# Patient Record
Sex: Male | Born: 1945 | Race: Black or African American | Hispanic: No | Marital: Single
Health system: Southern US, Community
[De-identification: ages and names within clinical notes are randomized; demographics above are authoritative.]

---

## 2006-10-06 ENCOUNTER — Emergency Department: Payer: Self-pay | Admitting: Emergency Medicine

## 2008-05-03 ENCOUNTER — Inpatient Hospital Stay: Payer: Self-pay | Admitting: Internal Medicine

## 2008-05-26 ENCOUNTER — Emergency Department: Payer: Self-pay | Admitting: Emergency Medicine

## 2008-09-03 ENCOUNTER — Ambulatory Visit: Payer: Self-pay | Admitting: Specialist

## 2010-05-12 ENCOUNTER — Ambulatory Visit: Payer: Self-pay | Admitting: Nephrology

## 2010-11-16 IMAGING — CT CT ABDOMEN AND PELVIS WITHOUT AND WITH CONTRAST
2 of 5 series · 11 of 42 positions shown, 17 images · non-contrast
Comparison: No comparison

REASON FOR EXAM: (1) gross hematuria, hypotension , to evaluate upper and
lower urinary tract; (2
COMMENTS:

PROCEDURE:     CT  - CT ABDOMEN / PELVIS  W/WO  - May 04, 2008  [DATE]
RESULT:     History: Hematuria
TECHNIQUE: Multiple axial images of the abdomen and pelvis were performed
from the lung bases to the pubic symphysis, without p.o. contrast and prior
to and following 100 ml of 5sovue-ISX intravenous contrast in the
nephrographic and excretory renal phases.

[Series 2: soft tissue w/o · axial · non-contrast · 0.70mm/px · z∈[-1508,-1198]mm · 8 of 80 slices shown, 13 images]
[im 9/80  soft-tissue]
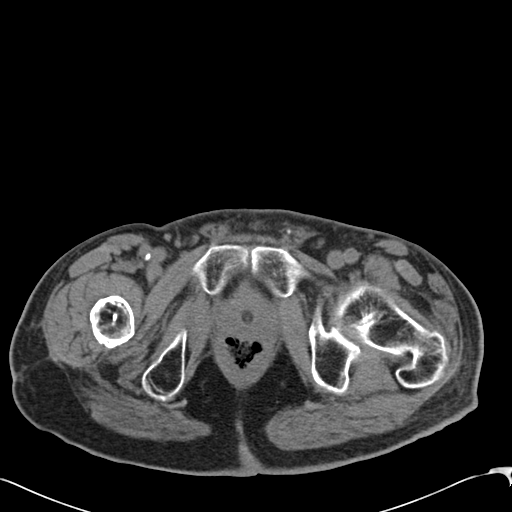
[im 9/80  bone]
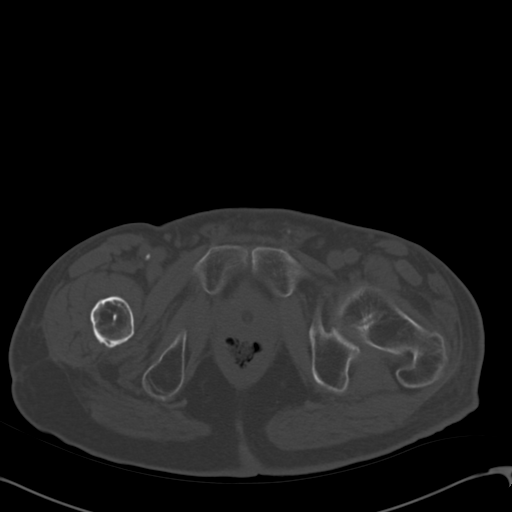
[im 18/80  soft-tissue]
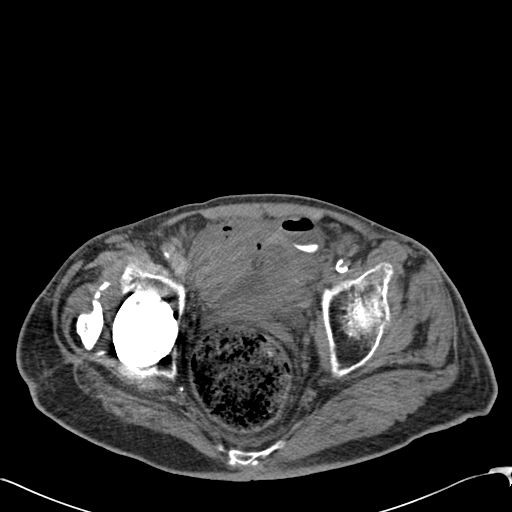
[im 27/80  soft-tissue]
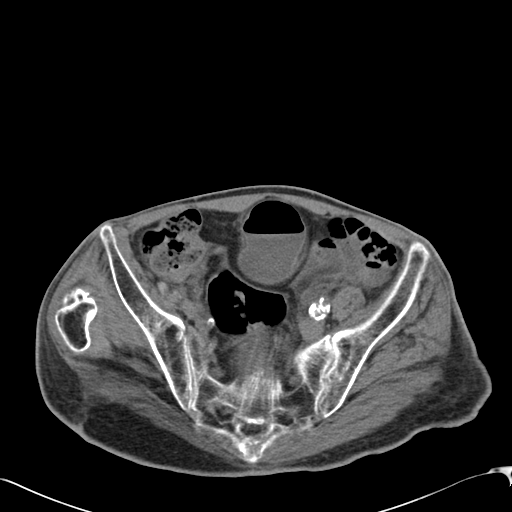
[im 36/80  soft-tissue]
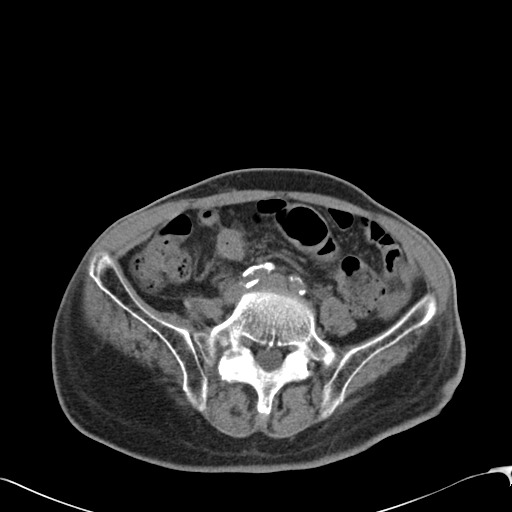
[im 44/80  soft-tissue]
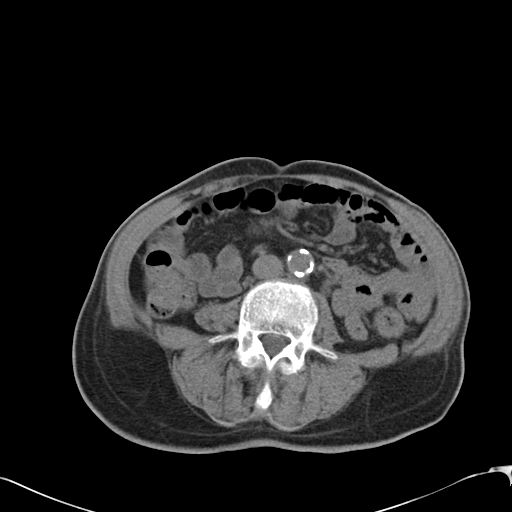
[im 44/80  lung]
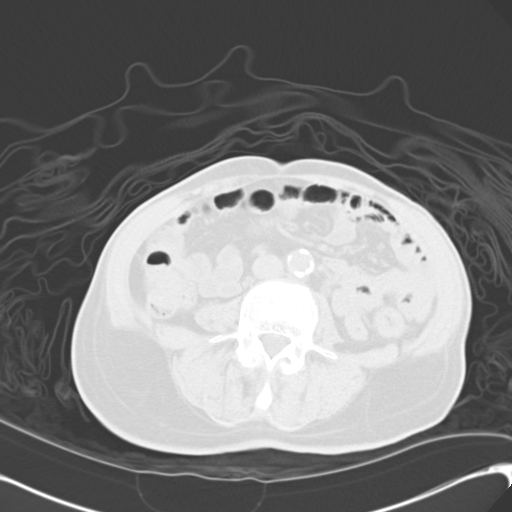
[im 53/80  soft-tissue]
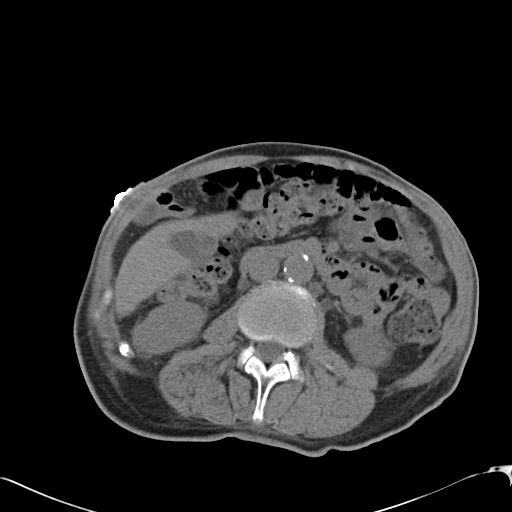
[im 53/80  lung]
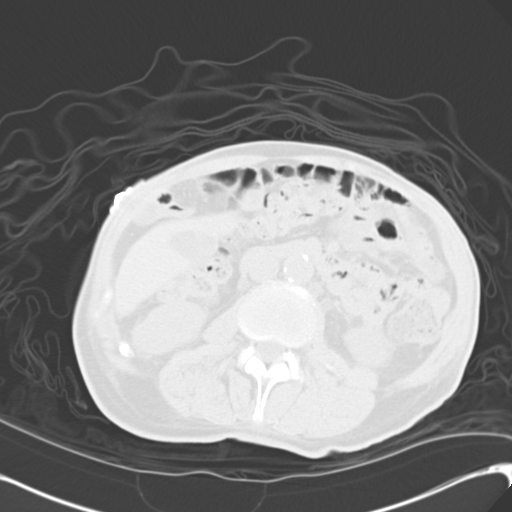
[im 62/80  soft-tissue]
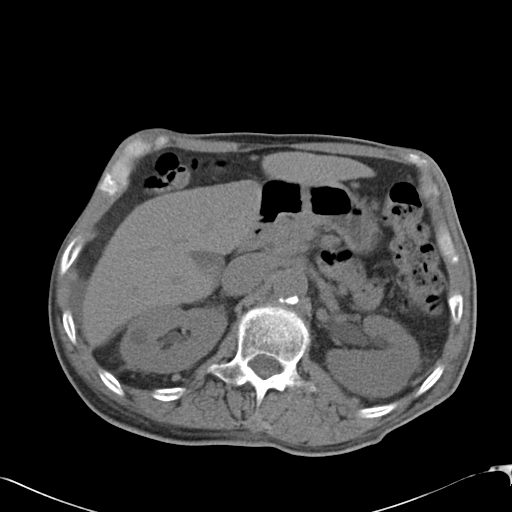
[im 62/80  lung]
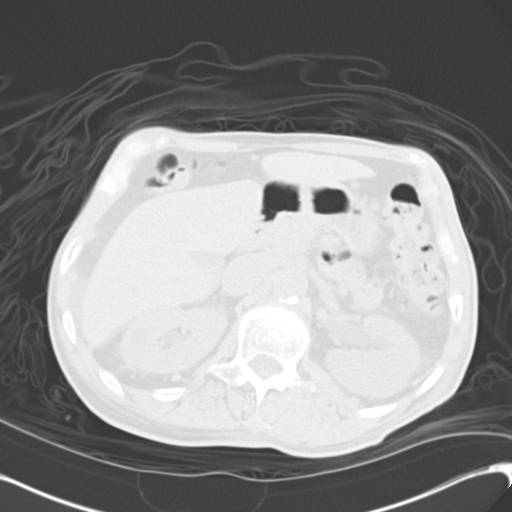
[im 71/80  soft-tissue]
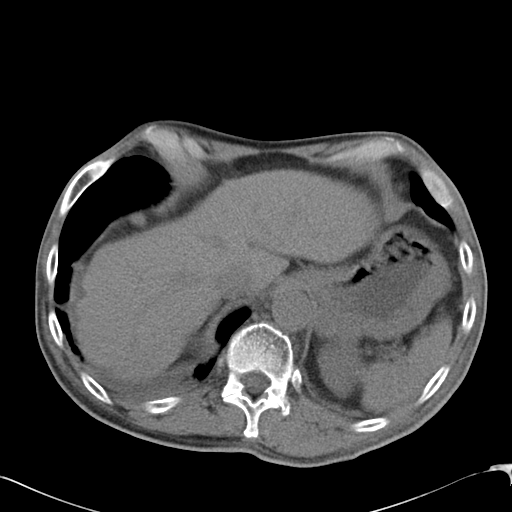
[im 71/80  lung]
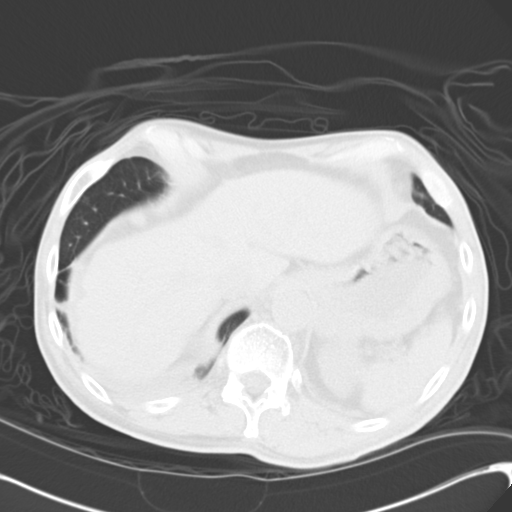

[Series 603: coronal · coronal · 0.78mm/px · 3 of 86 slices shown, 4 images]
[im 29/86  soft-tissue]
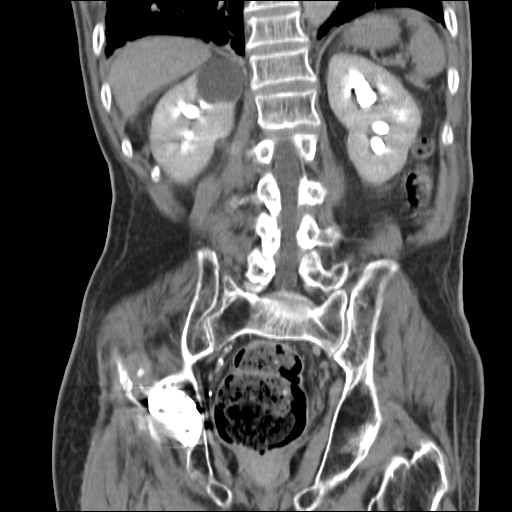
[im 38/86  soft-tissue]
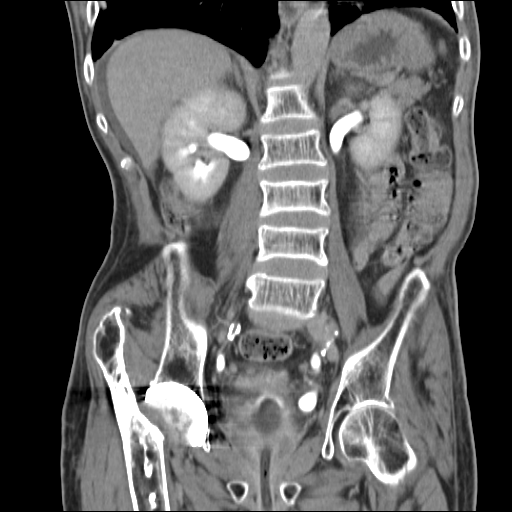
[im 38/86  bone]
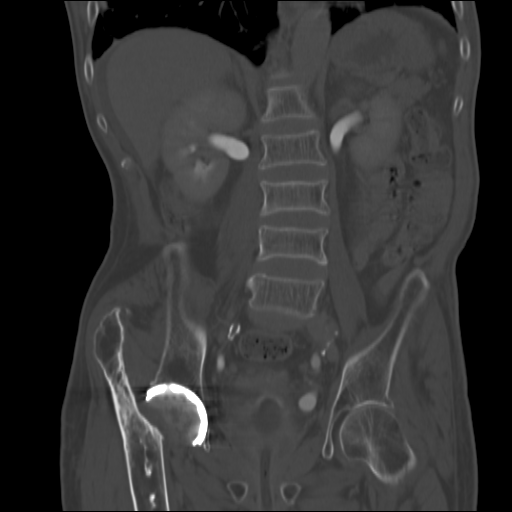
[im 48/86  soft-tissue]
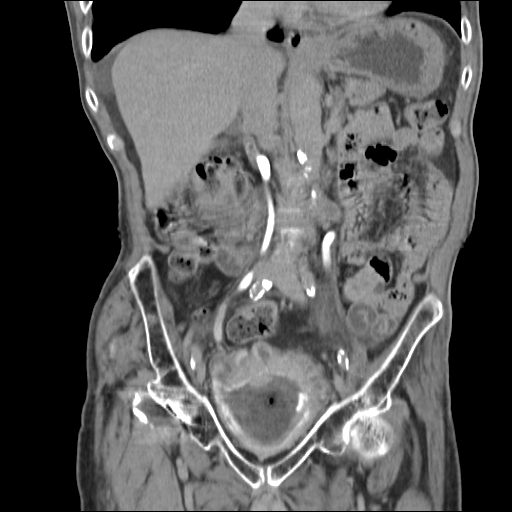

[11 of 42 positions shown; findings below may reference images not displayed]

FINDINGS: There are trace bilateral pleural effusions. There is bibasal or dependent
airspace disease likely representing atelectasis. The heart size is normal.

There are no renal, ureteral, or bladder calculi. There is mild
hydronephrosis and mild hydroureter. There is a 3.4 x 3.5 cm hypodense,
fluid attenuating right upper pole renal mass most consistent with a cyst.
There is a smaller hypodense right interpolar renal mass which is too small
to characterize. Bilateral kidneys enhance normally and symmetrically. There
is normal symmetric bilateral excretion of contrast without evidence of
filling defects.

There is a large irregular heterogeneously enhancing bladder mass. The
bladder demonstrates multiple air-fluid levels likely representing multiple
bladder diverticula. There is a Foley catheter present. There is
nonenhancing material surrounding the Foley catheter balloon which may
represent blood products.

The liver demonstrates no focal abnormality. There is no intrahepatic or
extrahepatic biliary ductal dilatation. The gallbladder is unremarkable. The
spleen demonstrates no focal abnormality. The  adrenal glands, and pancreas
are normal.

The unopacified stomach, duodenum, small intestine, and large intestine
demonstrate no gross abnormality, but evaluation is limited secondary to
lack of enteric contrast. There is no pneumoperitoneum, pneumatosis, or
portal venous gas. There is no abdominal or pelvic free fluid. There is no
lymphadenopathy.

The abdominal aorta is normal in caliber with atherosclerosis.

There is a right acetabular cup in place. There is an old right femoral neck
fracture. There is ostial lysis of the right femoral head. There is superior
migration of the left femur with the greater trochanter located at the level
of the iliac crest.
IMPRESSION: 1. There is a large irregular heterogeneously enhancing bladder mass versus
a severely trabeculated bladder. The bladder demonstrates multiple air-fluid
levels likely representing multiple bladder diverticula. There is a Foley
catheter present. There is nonenhancing material surrounding the Foley
catheter balloon which may represent blood products. Recommend urology
consultation.

## 2012-04-23 ENCOUNTER — Observation Stay: Payer: Self-pay | Admitting: Internal Medicine

## 2012-04-23 LAB — BASIC METABOLIC PANEL
Anion Gap: 7 (ref 7–16)
BUN: 14 mg/dL (ref 7–18)
Calcium, Total: 9.8 mg/dL (ref 8.5–10.1)
Co2: 27 mmol/L (ref 21–32)
EGFR (African American): >60
EGFR (Non-African Amer.): >60
Glucose: 72 mg/dL (ref 65–99)
Sodium: 137 mmol/L (ref 136–145)

## 2012-04-23 LAB — URINALYSIS, COMPLETE
Bilirubin,UR: NEGATIVE
Glucose,UR: NEGATIVE mg/dL (ref 0–75)
Ketone: NEGATIVE
Nitrite: NEGATIVE
Ph: 8 (ref 4.5–8.0)
RBC,UR: 333 /HPF (ref 0–5)
Squamous Epithelial: 14
WBC UR: 2924 /HPF (ref 0–5)

## 2012-04-23 LAB — CBC
HCT: 42.9 % (ref 40.0–52.0)
HGB: 14.1 g/dL (ref 13.0–18.0)
MCH: 29.4 pg (ref 26.0–34.0)
MCHC: 32.8 g/dL (ref 32.0–36.0)
Platelet: 457 10*3/uL — ABNORMAL HIGH (ref 150–440)
RBC: 4.79 10*6/uL (ref 4.40–5.90)
RDW: 15.7 % — ABNORMAL HIGH (ref 11.5–14.5)

## 2012-04-24 LAB — CBC WITH DIFFERENTIAL/PLATELET
Basophil #: 0.1 10*3/uL (ref 0.0–0.1)
Basophil %: 0.8 %
Eosinophil #: 0.3 10*3/uL (ref 0.0–0.7)
HCT: 36.1 % — ABNORMAL LOW (ref 40.0–52.0)
HGB: 11.9 g/dL — ABNORMAL LOW (ref 13.0–18.0)
Lymphocyte %: 17.9 %
MCHC: 32.9 g/dL (ref 32.0–36.0)
MCV: 89 fL (ref 80–100)
Monocyte %: 6.2 %
Neutrophil #: 7.7 10*3/uL — ABNORMAL HIGH (ref 1.4–6.5)
Neutrophil %: 72.1 %
Platelet: 382 10*3/uL (ref 150–440)
RBC: 4.05 10*6/uL — ABNORMAL LOW (ref 4.40–5.90)
RDW: 15.4 % — ABNORMAL HIGH (ref 11.5–14.5)

## 2012-04-26 LAB — URINE CULTURE

## 2012-06-08 DEATH — deceased

## 2014-04-30 NOTE — Consult Note (Signed)
PATIENT NAME:  Matthew Castillo, Matthew Castillo MR#:  [id number] DATE OF BIRTH:  [birth year]  DATE OF CONSULTATION:  04/24/2012  CONSULTING PHYSICIAN:  Madolyn Frieze. Achilles Dunk, MD  HISTORY OF PRESENT ILLNESS: The patient is a 69 year old African American gentleman who presented to the Emergency Room complaining of penile and bladder pain. He has been leaking around the Foley catheter. He was seen recently at [facility] in the Emergency Room. He was found to have multiple bladder stones. 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 He has not undergone evaluation in many years. He was diagnosed with a bladder infection at the time of presentation at [facility]. He did have 2 CT scans performed in close proximity through [facility] facilities. A CT scan in the Emergency Room at [facility] suggested a distal ureteral calculus. This is actually within a diverticulum. He has multiple bladder diverticula and wall thickening consistent with long-standing bladder dysfunction. He has been managing with spontaneous urination. 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 He has also likely been having some spasm related to hyperreflexia and the Foley catheter. The problem appears to be long-standing in nature. He states he is just very depressed and tired of dealing with the discomfort. He wants something to be done immediately. This will require more aggressive evaluation, which is not available at [facility] at this time. The Foley catheter was removed earlier today based on discussion with the hospitalist. He still complains of pain, requesting pain medication, although he appears very comfortable during the discussion.   PAST MEDICAL HISTORY: Significant for incomplete quadriplegia, neurogenic bladder, bladder calculi, chronic cystitis, chronic constipation   ALLERGIES: No known drug allergies.   MEDICATIONS ON ADMISSION: Tylenol 650 mg every 6 hours as needed, baclofen 5  mg 3 times a day, Ceftin 300 mg twice daily for 8 days that was prescribed at [facility], multivitamin 1 daily, Colace 100 mg twice daily, oxybutynin 5 mg 3 times a day, MiraLax twice daily as needed, Senokot tabs 2 daily, tramadol 50 mg every 6 hours as needed.   SOCIAL HISTORY: The patient relates an ongoing history of tobacco use. He denies any significant alcohol or drug use. He lives at home with his sister.   PHYSICAL EXAMINATION:  VITAL SIGNS: Stable.  HEENT: Within normal limits.  CHEST: Clear to auscultation bilaterally.  CARDIOVASCULAR: Regular rate and rhythm.  ABDOMEN: Soft, nondistended. Mild tenderness in the suprapubic area.  GENITOURINARY: External genitalia within normal limits.  EXTREMITIES: Paraplegic in the lower extremities with some gross movement in the upper extremities.  NEUROLOGIC: Motor and sensory grossly intact in the upper extremities.   ASSESSMENT: Long-standing bladder dysfunction, neurogenic bladder, chronic cystitis, bladder calculi, bladder diverticula, bladder pain/pressure.   RECOMMENDATION: This is a long ongoing problem that will require more formal evaluation. He will need videourodynamic studies to further evaluate his bladder. Removal of the Foley catheter is recommended at this time. 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 He does have some sensation as to his need to urinate. He has a followup scheduled at [facility] in approximately 1 week. This will be the most appropriate place for evaluation. He will need cystoscopy on an outpatient basis. Removal of the remaining bladder calculi could be undertaken at that time. The chronic constipation is also likely a factor in some of the discomfort. We will go ahead and start Flomax 0.4 mg to help with his bladder emptying. The use of  a low-dose hyoscyamine would also likely benefit. Orders have been written for this purpose. There is nothing emergent that can be offered at this time. Pain  control otherwise is all that is indicated.   If there are any further questions, please free to contact us. He should [facility] for formal evaluation.   ____________________________ Madolyn FriezeBrian S. Achilles Dunkope, MD bsc:jm D: 04/24/2012 20:55:23 ET T: 04/24/2012 21:40:30 ET JOB#: [id number]  cc: Madolyn FriezeBrian S. Achilles Dunkope, MD, <Dictator> Madolyn FriezeBRIAN S Jamesrobert Ohanesian MD ELECTRONICALLY SIGNED 04/28/2012 8:28

## 2014-04-30 NOTE — H&P (Signed)
PATIENT NAME:  Matthew Castillo, Matthew Castillo MR#:  [id number] DATE OF BIRTH:  1945-04-26  DATE OF ADMISSION:  04/23/2012  [facility]   CHIEF COMPLAINT: Penile and bladder pain.   HISTORY OF PRESENT ILLNESS: This is a 69 year old male who presents to the hospital with a penile bladder pain and also leaking around the Foley which was [facility]. The patient has a long history of bladder stones, was [facility], had a chronic Foley catheter placed, a 24-French, and discharged home.  The day after he got home, he noticed that it was leaking urine around the catheter. He also started to develop some penile and also bladder pain. He came to the ER for further evaluation. The patient was noted to have an abnormal urinalysis, and his Foley catheter has been changed here in the Emergency Room. The patient was on p.o. antibiotics for a UTI that he was [facility] on [facility] but continues to have an abnormal urinalysis. Due to his persistent urinary tract infection and failed outpatient therapy, hospitalist services were contacted for further treatment and evaluation.   REVIEW OF SYSTEMS:   CONSTITUTIONAL: No documented fever. No weight gain, no weight loss.  EYES: No blurry or double vision.  ENT: No tinnitus. No postnasal drip. No redness of the oropharynx.  RESPIRATORY: No cough, no wheeze. No hemoptysis.  CARDIOVASCULAR: No chest pain, no orthopnea, no palpitations, no syncope.  GASTROINTESTINAL: No nausea, no vomiting, no diarrhea, no abdominal pain, no melena or hematochezia.  GENITOURINARY: No dysuria or hematuria.  ENDOCRINE: No polyuria or nocturia. No heat or cold intolerance.  HEMATOLOGIC: No anemia, no bruising, no bleeding.  INTEGUMENTARY: No rashes. No lesions.  MUSCULOSKELETAL: No arthritis, no swelling. No gout.  NEUROLOGIC: No numbness, no tingling. No ataxia. No seizure-type activity.  PSYCHIATRIC: No anxiety. No insomnia. No ADD.   PAST MEDICAL  HISTORY: Consistent with paraplegia, neurogenic bladder, history of bladder stones, chronic constipation.  ALLERGIES: No known drug allergies.   SOCIAL HISTORY: He does smoke occasionally. He has been smoking for the past 20 to 30 years. No alcohol abuse. No illicit drug abuse. The patient lives at home with his sister.   FAMILY HISTORY: The patient's father died from complications of liver cirrhosis. Mother died from complications of kidney disease.   CURRENT MEDICATIONS: Tylenol 650 mg q. 6 hours as needed, baclofen 10 mg 1/2 tab t.i.d. as needed, Ceftin 300 mg b.i.d. for 8 days. The patient has finished this. Multivitamin daily, Colace 100 mg b.i.d., oxybutynin 5 mg t.i.d., MiraLax b.i.d. as needed, Senokot 2 tabs daily, tramadol 50 mg q. 6 hours as needed.   PHYSICAL EXAMINATION:   VITAL SIGNS: Temperature is 97.4, pulse 82, respirations 20, blood pressure 112/79, sats 96% on room air.  GENERAL: He is a pleasant-appearing male in no apparent distress.  HEENT: Atraumatic, normocephalic. Extraocular muscles are intact. Pupils are equal and reactive to light. Sclerae are anicteric. No conjunctival injection. No pharyngeal erythema.  NECK: Supple. There is no jugular venous distention. No bruits, no lymphadenopathy, no thyromegaly.  HEART: Regular rate and rhythm. No murmurs, no rubs and no clicks.  LUNGS: Clear to auscultation bilaterally. No rales or rhonchi. No wheezes.  ABDOMEN: Soft, flat, nontender, nondistended. Has good bowel sounds. No hepatosplenomegaly appreciated.  EXTREMITIES: No evidence of any cyanosis, clubbing, or peripheral edema. Has +2 pedal and radial pulses bilaterally.  NEUROLOGICAL: He is alert, awake and oriented x3. He is paraplegic. No other focal motor  or sensory deficits are appreciated bilaterally.  SKIN: Moist and warm with no rashes appreciated.  LYMPHATIC: There is no cervical or axillary lymphadenopathy.   LABORATORY AND RADIOLOGICAL DATA:  Serum glucose of  72, BUN 14, creatinine 0.6, sodium 137, potassium 3.9, chloride 103, bicarbonate 27. White cell count 10.8, hemoglobin 14.1, hematocrit 42.9, platelet count of 457  . Urinalysis shows 3+ leukocyte esterase, 2900 white cells and 3+ bacteria.   The patient did have a CT of the abdomen and pelvis done with contrast showing a 3.6 infrarenal suprailiac abdominal aortic aneurysm and bilateral common iliac aneurysms. The patient also had a CT of the pelvis done without contrast which showed left nephrolithiasis.  Stone debris in the bladder cannot be excluded. Foley catheter in the bladder.  Bladder is nondistended. Air is present within the bladder. This could be due to instrumentation or infection. A prominently distended rectum with a large amount of stool consistent with constipation.   ASSESSMENT AND PLAN: This is a 70 year old male with a history of paraplegia, neurogenic bladder, history of bladder stones, chronic constipation who presents to the hospital due to penile bladder pain and also leaking around the Foley that was [facility].   1. Urinary tract infection and leaking Foley: The patient's Foley has been replaced in the Emergency Room and is no longer leaking. He does have an abnormal urinalysis consistent with a UTI, although I am not sure if this is a true UTI versus colonization given instrumentation last week at [facility]. He is afebrile. He has a mildly elevated white cell count. He has finished 8 days of Omnicef. I will start the patient on IV Cipro for now, follow urine cultures.  He should likely follow up with his urologist from [facility] as an outpatient upon discharge.  His urologist is Dr. Emelda Fear at [facility]. His cell phone number is [phone].  2. Neurogenic bladder: I will continue with his oxybutynin. 3. Chronic constipation: Continue Senokot. Continue Colace but use Dulcolax suppository if needed.  4. Paraplegia: Continue baclofen and tramadol as needed for pain.   CODE STATUS:  The patient is a FULL CODE.    TIME SPENT: 50 minutes. ____________________________ Rolly Pancake. Cherlynn Kaiser, MD vjs:cb D: 04/23/2012 20:17:34 ET T: 04/23/2012 20:38:41 ET JOB#: [id number]  cc: Rolly Pancake. Cherlynn Kaiser, MD, <Dictator> Houston Siren MD ELECTRONICALLY SIGNED 04/24/2012 21:42

## 2014-04-30 NOTE — Discharge Summary (Signed)
PATIENT NAME:  Matthew Castillo, Matthew Castillo MR#:  [id number] DATE OF BIRTH:  24-Feb-1945  DATE OF ADMISSION:  04/23/2012 DATE OF DISCHARGE: 04/26/2012    DISPOSITION: To home.   CONSULTATION: Urology consult with Dr. Achilles Dunkope.   DISCHARGE DIAGNOSES:  1. Urinary tract infection.  2. Bladder spasms. 3. Bladder debris.  4. History of bladder stones.  5. History of paraplegia.  6. Chronic constipation.   DISCHARGE MEDICATIONS:  1. Colace 100 mg p.o. b.i.d.  2. Oxybutynin 5 mg p.o. t.i.d.  3. MiraLax as needed for constipation.  4. Senna 2 tablets p.o. daily.  5. Fleet enemas as needed for a bowel movement if he did not have bowel movement in 24 hours. 6. Tamsulosin 0.4 mg daily.  7. Hyoscyamine/methenamine/methylene blue/phenyl salicylate 1 capsule p.o. 4 times daily. 8. Levofloxacin 500 mg p.o. daily.  for 10 days 9. Baclofen 10 mg 1/2-tablet every 8 hours as needed for muscle cramps.   DIET: Regular diet.   FOLLOWUP: [facility], Dr. Emelda FearFerguson, in a week.   HOSPITAL COURSE: A 69 year old male with history of paraplegia, has chronic bladder spasms and neurogenic bladder, admitted on 16th of April because of penile and bladder pain. [facility], was discharged with Foley. The patient went home. The patient admitted because of severe bladder pain and penile pain and also leaking around the catheter site. [facility]. The patient was admitted to the hospitalist service for possible UTI with bladder spasms. The patient'slab data showed BMP, creatinine was 0.62 and UA was cloudy ,LE 3,bacteria 3, WBC was 10.8 on the blood work. CT of the abdomen and pelvis showed left nephrolithiasis, some debris cannot be excluded. The patient also found to have large amount of stool in dist ended  rectum consistent with constipation. The patient seen by Dr. Achilles Dunkope, urologist, who recommended to discontinue Foley as he might be having a little hyperreflexia causing  bladders spasms, and the patient's Foley was discontinued. The patient had very severe bladder pain and was very tired of dealing with discomfort, so he started on Flomax along with hyoscyamine/methenamine capsule for bladder spasms, and Dr. Achilles Dunkope recommended that the patient needs no immediate [facility]. The patient's CT scans were reviewed by him, and the patient has several small stones within the bladder, and he needs to [facility] for further workup of his neurogenic bladder dysfunction along with cystoscopy and urodynamic studies to evaluate the bladder pressure. The patient is started on Flomax along with continuing his baclofen, and Toradol also was given to help with bladder spasms. The patient's urine culture showed gram-negative rods with Morganella more than 100,000 colonies. The patient initially started on Cipro on admission. His urine cultures showed Morganella morganii species, and it is sensitive to Levaquin, Cipro as well. I discharge him with Levaquin 500 daily for 10 days. The patient's UTI is resistant to nitrofurantoin, cephazolin and ampicillin. It is sensitive to ceftriaxone, Cipro, gentamicin, imipenem, cefoxitin, Levaquin, and it is resistant to Bactrim as well. The patient is stable for discharge today. [facility], Dr. Emelda FearFerguson, urologist, in a week.   Of note, after the Foley was discontinued, the patient is voiding well and has no trouble urinating, and he denies any complaints of retention.  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 CT abdomen with contrast also is done and did not show  any leak. The patient has no symptoms.  Constipation. He is on chronic stool softeners, he can use it as needed.   TIME SPENT ON DISCHARGE PREPARATION: More than 30 minutes.  ____________________________ Katha Hamming, MD sk:OSi D: 04/26/2012 10:57:39 ET T: 04/26/2012 13:58:54  ET JOB#: [id number]  cc: Katha Hamming, MD, <Dictator> Julius Bowels, MD, Lodema Pilot. Achilles Dunk, MD Katha Hamming MD ELECTRONICALLY SIGNED 05/08/2012 22:30

## 2014-04-30 NOTE — Consult Note (Signed)
Patient seen, chart reviewed, films reviewed, note dictated. Bladder calculi, bladder diverticuli, bladder pain/pressure, detrusor hyperreflexia There is no immediate problem to explain the patient's complaint of discomfort.  It appears that has been present for some time.  He states he is just tired of dealing with the discomfort.  This is not an issue that can be corrected immediately.  He has long-standing bladder dysfunction related to his spinal cord injury.  There is no evidence of a stone within the ureter.  He has had 2 prior CT [facility].  These have all been [facility].  This is actually a stone within a diverticulum.  There are several remaining small stones within the bladder compared to a recent irrigation [facility].  The stones are likely related to bacteria from chronic bladder dysfunction.  He has [facility] for evaluation and workup of his neurogenic bladder dysfunction.  The leakage around the Foley catheter was likely related to hyperreflexia and bladder spasms.  There is no current need for catheter replacement.  This can also be an aggravating source for discomfort.  It would be reasonable to utilize Flomax as well as some hyoscyamine to help reduce some of the irritability.  He will need cystoscopy on an outpatient basis.  He will likely need urodynamic studies to evaluate bladder pressures.  He will likely need more aggressive intervention which will be determined based on evaluation findings.  There is nothing further that can be offered at [facility] at this time.  He [facility].  Electronic Signatures: Smith Robertope, Bethani Brugger S (MD)  (Signed on 17-Apr-14 20:32)  Authored  Last Updated: 17-Apr-14 20:32 by Smith Robertope, Tayt Moyers S (MD)
